# Patient Record
Sex: Male | Born: 1995 | Race: White | Hispanic: No | Marital: Single | Smoking: Current some day smoker
Health system: Southern US, Community
[De-identification: ages and names within clinical notes are randomized; demographics above are authoritative.]

## PROBLEM LIST (undated history)

## (undated) DIAGNOSIS — F319 Bipolar disorder, unspecified: Secondary | ICD-10-CM

---
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
## 2015-10-16 ENCOUNTER — Emergency Department (HOSPITAL_COMMUNITY)
Admission: EM | Admit: 2015-10-16 | Discharge: 2015-10-17 | Discharge status: Against Medical Advice | Payer: Self-pay | Attending: Emergency Medicine | Admitting: Emergency Medicine

## 2015-10-16 ENCOUNTER — Emergency Department (HOSPITAL_COMMUNITY): Payer: Self-pay

## 2015-10-16 ENCOUNTER — Encounter (HOSPITAL_COMMUNITY): Payer: Self-pay | Admitting: Emergency Medicine

## 2015-10-16 DIAGNOSIS — L03211 Cellulitis of face: Secondary | ICD-10-CM

## 2015-10-16 DIAGNOSIS — R110 Nausea: Secondary | ICD-10-CM | POA: Insufficient documentation

## 2015-10-16 DIAGNOSIS — F17200 Nicotine dependence, unspecified, uncomplicated: Secondary | ICD-10-CM | POA: Insufficient documentation

## 2015-10-16 DIAGNOSIS — K047 Periapical abscess without sinus: Secondary | ICD-10-CM | POA: Insufficient documentation

## 2015-10-16 DIAGNOSIS — Z791 Long term (current) use of non-steroidal anti-inflammatories (NSAID): Secondary | ICD-10-CM | POA: Insufficient documentation

## 2015-10-16 LAB — CBC WITH DIFFERENTIAL/PLATELET
BASOS ABS: 0 10*3/uL (ref 0.0–0.1)
BASOS PCT: 0 %
EOS PCT: 1 %
Eosinophils Absolute: 0.2 10*3/uL (ref 0.0–0.7)
HCT: 47.9 % (ref 39.0–52.0)
Hemoglobin: 17.1 g/dL — ABNORMAL HIGH (ref 13.0–17.0)
Lymphocytes Relative: 17 %
Lymphs Abs: 2.8 10*3/uL (ref 0.7–4.0)
MCH: 32.3 pg (ref 26.0–34.0)
MCHC: 35.7 g/dL (ref 30.0–36.0)
MCV: 90.5 fL (ref 78.0–100.0)
MONO ABS: 1.7 10*3/uL — AB (ref 0.1–1.0)
MONOS PCT: 10 %
Neutro Abs: 11.9 10*3/uL — ABNORMAL HIGH (ref 1.7–7.7)
Neutrophils Relative %: 72 %
PLATELETS: 228 10*3/uL (ref 150–400)
RBC: 5.29 MIL/uL (ref 4.22–5.81)
RDW: 12.6 % (ref 11.5–15.5)
WBC: 16.7 10*3/uL — ABNORMAL HIGH (ref 4.0–10.5)

## 2015-10-16 LAB — BASIC METABOLIC PANEL
ANION GAP: 10 (ref 5–15)
BUN: 15 mg/dL (ref 6–20)
CALCIUM: 9.3 mg/dL (ref 8.9–10.3)
CO2: 25 mmol/L (ref 22–32)
CREATININE: 0.91 mg/dL (ref 0.61–1.24)
Chloride: 100 mmol/L — ABNORMAL LOW (ref 101–111)
GFR calc Af Amer: >60 mL/min (ref >60)
GLUCOSE: 86 mg/dL (ref 65–99)
Potassium: 3.8 mmol/L (ref 3.5–5.1)
Sodium: 135 mmol/L (ref 135–145)

## 2015-10-16 MED ORDER — MORPHINE SULFATE (PF) 4 MG/ML IV SOLN
4.0000 mg | Freq: Once | INTRAVENOUS | Status: AC
  Administered 2015-10-16: 4 mg via INTRAVENOUS
  Filled 2015-10-16: qty 1

## 2015-10-16 MED ORDER — IOPAMIDOL (ISOVUE-300) INJECTION 61%
75.0000 mL | Freq: Once | INTRAVENOUS | Status: AC | PRN
Start: 2015-10-16 — End: 2015-10-16
  Administered 2015-10-16: 75 mL via INTRAVENOUS

## 2015-10-16 MED ORDER — CLINDAMYCIN PHOSPHATE 600 MG/50ML IV SOLN
600.0000 mg | Freq: Once | INTRAVENOUS | Status: AC
  Administered 2015-10-16: 600 mg via INTRAVENOUS
  Filled 2015-10-16: qty 50

## 2015-10-16 MED ORDER — ONDANSETRON 8 MG PO TBDP
8.0000 mg | ORAL_TABLET | Freq: Once | ORAL | Status: AC
  Administered 2015-10-16: 8 mg via ORAL
  Filled 2015-10-16: qty 1

## 2015-10-16 NOTE — ED Triage Notes (Signed)
Pt c/o right sided mouth pain x 4 days.

## 2015-10-17 MED ORDER — HYDROCODONE-ACETAMINOPHEN 5-325 MG PO TABS: 1.0000 | ORAL_TABLET | ORAL | 0 refills | Status: DC | PRN

## 2015-10-17 MED ORDER — CLINDAMYCIN HCL 150 MG PO CAPS: 300.0000 mg | ORAL_CAPSULE | Freq: Four times a day (QID) | ORAL | 0 refills | Status: DC

## 2015-10-17 MED ORDER — HYDROCODONE-ACETAMINOPHEN 5-325 MG PO TABS: 2.0000 | ORAL_TABLET | ORAL | 0 refills | Status: DC | PRN

## 2015-10-17 NOTE — ED Provider Notes (Signed)
[facility] DEPT Provider Note   CSN: [id number] Arrival date & time: 10/16/15  1943     History   Chief Complaint Chief Complaint  Patient presents with  . Dental Pain    HPI Clinton Roberts is a 20 y.o. male.  The history is provided by the patient and the spouse.  Dental Pain   This is a new problem. Episode onset: 4 days ago. The problem occurs constantly. The problem has been gradually worsening. The pain is at a severity of 10/10. The pain is severe. The treatment provided no (He has tried ibuprofen without relief.  describes chronic dental decay but no problems with infections/ abscess until this week.  He does not have a dentist.) relief.    Past Medical History:  Diagnosis Date  . Bipolar disorder (HCC)     There are no active problems to display for this patient.   Past Surgical History:  Procedure Laterality Date  . I&D EXTREMITY  08/02/2011   Procedure: IRRIGATION AND DEBRIDEMENT EXTREMITY;  Surgeon: Sharma Covert, MD;  Location: [facility] OR;  Service: Orthopedics;  Laterality: Right;  Irrigation and debridement with tendon repair and closure of lacerations right forearm       Home Medications    Prior to Admission medications   Medication Sig Start Date End Date Taking? Authorizing Provider  ibuprofen (ADVIL,MOTRIN) 200 MG tablet Take 200 mg by mouth every 6 (six) hours as needed for mild pain or moderate pain.   Yes Historical Provider, MD  clindamycin (CLEOCIN) 150 MG capsule Take 2 capsules (300 mg total) by mouth 4 (four) times daily. 10/17/15   Burgess Amor, PA-C  HYDROcodone-acetaminophen (NORCO/VICODIN) 5-325 MG tablet Take 1 tablet by mouth every 4 (four) hours as needed. 10/17/15   Burgess Amor, PA-C  HYDROcodone-acetaminophen (NORCO/VICODIN) 5-325 MG tablet Take 2 tablets by mouth every 4 (four) hours as needed. 10/17/15   Burgess Amor, PA-C    Family History No family history on file.  Social History Social History  Substance Use Topics  . Smoking status:  Current Some Day Smoker  . Smokeless tobacco: Never Used  . Alcohol use Yes     Allergies   Review of patient's allergies indicates no known allergies.   Review of Systems Review of Systems  Constitutional: Positive for chills. Negative for fever.  HENT: Positive for dental problem and facial swelling. Negative for sore throat.   Eyes: Positive for pain. Negative for photophobia and visual disturbance.  Respiratory: Negative for shortness of breath.   Gastrointestinal: Positive for nausea. Negative for vomiting.  Musculoskeletal: Negative for neck pain and neck stiffness.  Skin: Positive for color change.     Physical Exam Updated Vital Signs BP 141/72 (BP Location: Left Arm)   Pulse 77   Temp 98.6 F (37 C) (Oral)   Resp 18   Ht 6\' 1"  (1.854 m)   Wt 83.9 kg   SpO2 99%   BMI 24.41 kg/m   Physical Exam  Constitutional: He is oriented to person, place, and time. He appears well-developed and well-nourished. No distress.  Appears uncomfortable.  HENT:  Head: Normocephalic and atraumatic.  Right Ear: Tympanic membrane and external ear normal.  Left Ear: Tympanic membrane and external ear normal.  Nose: Sinus tenderness present.  Mouth/Throat: Oropharynx is clear and moist and mucous membranes are normal. No oral lesions. No trismus in the jaw. Dental abscesses and dental caries present.  Induration and ttp with mild erythema along left nose tracking nearly to  his medial canthus.  Ttp. No fluctuance.  No areas of fluctuance found within oral mucosa.  Poor dentition with multiple areas of decay.  Infection begins around the left upper molars, possibly involving several teeth.   Eyes: Conjunctivae are normal. Left eye exhibits no chemosis, no discharge and no exudate.  Ocular pain with inferior gaze and medial gaze.  Neck: Normal range of motion. Neck supple.  Cardiovascular: Normal rate and normal heart sounds.   Pulmonary/Chest: Effort normal.  Abdominal: He exhibits no  distension.  Musculoskeletal: Normal range of motion.  Lymphadenopathy:    He has no cervical adenopathy.  Neurological: He is alert and oriented to person, place, and time.  Skin: Skin is warm and dry. No erythema.  Psychiatric: He has a normal mood and affect.     ED Treatments / Results  Labs (all labs ordered are listed, but only abnormal results are displayed) Labs Reviewed  CBC WITH DIFFERENTIAL/PLATELET - Abnormal; Notable for the following:       Result Value   WBC 16.7 (*)    Hemoglobin 17.1 (*)    Neutro Abs 11.9 (*)    Monocytes Absolute 1.7 (*)    All other components within normal limits  BASIC METABOLIC PANEL - Abnormal; Notable for the following:    Chloride 100 (*)    All other components within normal limits    EKG  EKG Interpretation None       Radiology Ct Maxillofacial W Contrast  Result Date: 10/16/2015 CLINICAL DATA:  Right-sided jaw pain and swelling. Concern for abscess. EXAM: CT MAXILLOFACIAL WITH CONTRAST TECHNIQUE: Multidetector CT imaging of the maxillofacial structures was performed with intravenous contrast. Multiplanar CT image reconstructions were also generated. A small metallic BB was placed on the right temple in order to reliably differentiate right from left. CONTRAST:  75mL ISOVUE-300 IOPAMIDOL (ISOVUE-300) INJECTION 61% COMPARISON:  None. FINDINGS: Osseous: --Complex facial fracture types: No LeFort, zygomaticomaxillary complex or nasoorbitoethmoidal fracture. --Simple fracture types: None. Orbits: The globes appear intact. Normal appearance of the intra- and extraconal fat. Symmetric extraocular muscles. Sinuses: No fluid levels or advanced mucosal thickening. Soft tissues: There are numerous bilateral cervical lymph nodes measuring up to 1.2 cm. No focal fluid collection or evidence of abscess. Prominence of the adenoid tonsils is normal for age. No retropharyngeal abscess. No necrotic adenopathy. Oral cavity: There is evidence of advanced  dental disease with large caries within the right first mandibular molar, at the right maxillary central incisor and at the left maxillary second molar. There are prominent periapical lucencies at the roots of the left maxillary first and second molars. There is no fluid collection or abscess within the oral cavity. The soft tissues adjacent to the right mandible are unremarkable. Limited intracranial: Normal. IMPRESSION: 1. No evidence of facial or oral cavity abscess or drainable fluid collection. 2. Multifocal severe dental disease, worst at the right first mandibular molar and maxillary central incisor and at the left maxillary first and second molars. Electronically Signed   By: Deatra Robinson M.D.   On: 10/16/2015 23:31    Procedures Procedures (including critical care time)  Medications Ordered in ED Medications  clindamycin (CLEOCIN) IVPB 600 mg (0 mg Intravenous Stopped 10/17/15 0016)  ondansetron (ZOFRAN-ODT) disintegrating tablet 8 mg (8 mg Oral Given 10/16/15 2213)  morphine 4 MG/ML injection 4 mg (4 mg Intravenous Given 10/16/15 2212)  iopamidol (ISOVUE-300) 61 % injection 75 mL (75 mLs Intravenous Contrast Given 10/16/15 2251)     Initial Impression /  Assessment and Plan / ED Course  I have reviewed the triage vital signs and the nursing notes.  Pertinent labs & imaging results that were available during my care of the patient were reviewed by me and considered in my medical decision making (see chart for details).  Clinical Course    No abscess or periorbital cellulitis per CT.  Pt was given iv clindamycin, advised overnight stay for further abx, pt declined, stating he has to sign a lease in the morning on their new house or will lose housing.  Pt will be signed out ama.  Prescribed clindamycin, hydrocodone given.  Given strict return precautions.  Pt and wife understand plan and will return in 24 hours for a recheck if sx are not starting to regress, sooner for any worsened sx.  Pt  was seen by Dr. Adriana Simasook during this visit.  Final Clinical Impressions(s) / ED Diagnoses   Final diagnoses:  Dental infection  Facial cellulitis    New Prescriptions Discharge Medication List as of 10/17/2015 12:08 AM    START taking these medications   Details  clindamycin (CLEOCIN) 150 MG capsule Take 2 capsules (300 mg total) by mouth 4 (four) times daily., Starting Fri 10/17/2015, Print    !! HYDROcodone-acetaminophen (NORCO/VICODIN) 5-325 MG tablet Take 1 tablet by mouth every 4 (four) hours as needed., Starting Fri 10/17/2015, Print    !! HYDROcodone-acetaminophen (NORCO/VICODIN) 5-325 MG tablet Take 2 tablets by mouth every 4 (four) hours as needed., Starting Fri 10/17/2015, Print     !! - Potential duplicate medications found. Please discuss with provider.       Burgess AmorJulie Drea Jurewicz, PA-C 10/17/15 1415    Donnetta HutchingBrian Cook, MD 10/20/15 [phone]

## 2015-10-18 ENCOUNTER — Emergency Department (HOSPITAL_COMMUNITY)
Admission: EM | Admit: 2015-10-18 | Discharge: 2015-10-18 | Disposition: A | Discharge status: Home / Self Care | Payer: Medicaid Other | Attending: Emergency Medicine | Admitting: Emergency Medicine

## 2015-10-18 ENCOUNTER — Encounter (HOSPITAL_COMMUNITY): Payer: Self-pay | Admitting: Emergency Medicine

## 2015-10-18 DIAGNOSIS — F17210 Nicotine dependence, cigarettes, uncomplicated: Secondary | ICD-10-CM | POA: Insufficient documentation

## 2015-10-18 DIAGNOSIS — K029 Dental caries, unspecified: Secondary | ICD-10-CM | POA: Insufficient documentation

## 2015-10-18 LAB — CBC WITH DIFFERENTIAL/PLATELET
Basophils Absolute: 0 10*3/uL (ref 0.0–0.1)
Basophils Relative: 0 %
EOS PCT: 1 %
Eosinophils Absolute: 0.1 10*3/uL (ref 0.0–0.7)
HEMATOCRIT: 44.7 % (ref 39.0–52.0)
Hemoglobin: 15.8 g/dL (ref 13.0–17.0)
Lymphocytes Relative: 22 %
Lymphs Abs: 2.7 10*3/uL (ref 0.7–4.0)
MCH: 31.9 pg (ref 26.0–34.0)
MCHC: 35.3 g/dL (ref 30.0–36.0)
MCV: 90.3 fL (ref 78.0–100.0)
MONO ABS: 1.4 10*3/uL — AB (ref 0.1–1.0)
Monocytes Relative: 11 %
NEUTROS ABS: 8 10*3/uL — AB (ref 1.7–7.7)
Neutrophils Relative %: 66 %
PLATELETS: 218 10*3/uL (ref 150–400)
RBC: 4.95 MIL/uL (ref 4.22–5.81)
RDW: 12.4 % (ref 11.5–15.5)
WBC: 12.1 10*3/uL — ABNORMAL HIGH (ref 4.0–10.5)

## 2015-10-18 LAB — BASIC METABOLIC PANEL
Anion gap: 5 (ref 5–15)
BUN: 10 mg/dL (ref 6–20)
CALCIUM: 9.2 mg/dL (ref 8.9–10.3)
CO2: 27 mmol/L (ref 22–32)
CREATININE: 0.79 mg/dL (ref 0.61–1.24)
Chloride: 103 mmol/L (ref 101–111)
GFR calc Af Amer: >60 mL/min (ref >60)
Glucose, Bld: 88 mg/dL (ref 65–99)
POTASSIUM: 3.9 mmol/L (ref 3.5–5.1)
SODIUM: 135 mmol/L (ref 135–145)

## 2015-10-18 LAB — LACTIC ACID, PLASMA: LACTIC ACID, VENOUS: 0.5 mmol/L (ref 0.5–1.9)

## 2015-10-18 MED ORDER — CLINDAMYCIN PHOSPHATE 600 MG/50ML IV SOLN
600.0000 mg | Freq: Once | INTRAVENOUS | Status: AC
  Administered 2015-10-18: 600 mg via INTRAVENOUS
  Filled 2015-10-18: qty 50

## 2015-10-18 MED ORDER — MORPHINE SULFATE (PF) 4 MG/ML IV SOLN
4.0000 mg | INTRAVENOUS | Status: DC | PRN
  Administered 2015-10-18: 4 mg via INTRAVENOUS
  Filled 2015-10-18: qty 1

## 2015-10-18 NOTE — ED Triage Notes (Signed)
Reports pain and swelling in upper left mouth.  Was here a few days ago for same and swelling getting worse.  Reports taking abx.

## 2015-10-18 NOTE — Discharge Instructions (Signed)
Take continue to take your prescriptions as previously directed. Rinse your mouth frequently with warm water daily. Call your regular dentist on Monday to schedule a follow up appointment within the next 48 hours. If you cannot be seen by your dentist or family doctor, the return to the ED for a re-check. Return to the Emergency Department immediately sooner if worsening.

## 2015-10-18 NOTE — ED Provider Notes (Signed)
[facility] Provider Note   CSN: [id number] Arrival date & time: 10/18/15  1150     History   Chief Complaint Chief Complaint  Patient presents with  . Dental Pain    HPI Clinton Roberts is a 20 y.o. male.  HPI  Pt was seen at 1350. Per pt, c/o gradual onset and worsening of persistent left upper teeth "pain" for the past 6 days. Has been associated with facial swelling and subjective fevers/chills. Pt was evaluated in the ED 2 days ago for his symptoms, CT negative for abscess, dx dental infection, and rx clindamycin. Pt states he took his first dose of antibiotic yesterday. Pt states last night the pain in his teeth "got worse" and "then a spot opened up and started to drain" which improved the discomfort.  Pt states he "was supposed to get admitted" but left AMA due to housing issues.  Denies fevers, no intra-oral edema, no rash, no dysphagia, no neck pain.     Past Medical History:  Diagnosis Date  . Bipolar disorder (HCC)     There are no active problems to display for this patient.   Past Surgical History:  Procedure Laterality Date  . I&D EXTREMITY  08/02/2011   Procedure: IRRIGATION AND DEBRIDEMENT EXTREMITY;  Surgeon: Sharma CovertFred W Ortmann, MD;  Location: [facility] OR;  Service: Orthopedics;  Laterality: Right;  Irrigation and debridement with tendon repair and closure of lacerations right forearm       Home Medications    Prior to Admission medications   Medication Sig Start Date End Date Taking? Authorizing Provider  clindamycin (CLEOCIN) 150 MG capsule Take 2 capsules (300 mg total) by mouth 4 (four) times daily. 10/17/15   Burgess AmorJulie Idol, PA-C  HYDROcodone-acetaminophen (NORCO/VICODIN) 5-325 MG tablet Take 1 tablet by mouth every 4 (four) hours as needed. 10/17/15   Burgess AmorJulie Idol, PA-C  HYDROcodone-acetaminophen (NORCO/VICODIN) 5-325 MG tablet Take 2 tablets by mouth every 4 (four) hours as needed. 10/17/15   Burgess AmorJulie Idol, PA-C  ibuprofen (ADVIL,MOTRIN) 200 MG tablet Take 200 mg by  mouth every 6 (six) hours as needed for mild pain or moderate pain.    Historical Provider, MD    Family History History reviewed. No pertinent family history.  Social History Social History  Substance Use Topics  . Smoking status: Current Some Day Smoker    Packs/day: 1.00    Types: Cigarettes  . Smokeless tobacco: Never Used  . Alcohol use Yes     Allergies   Review of patient's allergies indicates no known allergies.   Review of Systems Review of Systems ROS: Statement: All systems negative except as marked or noted in the HPI; Constitutional: Negative for objective fever and +chills. ; ; Eyes: Negative for eye pain and discharge. ; ; ENMT: Positive for facial swelling, dental caries, dental hygiene poor and toothache. Negative for ear pain, bleeding gums, dental injury, facial deformity, hoarseness, nasal congestion, sinus pressure, sore throat, throat swelling and tongue swollen. ; ; Cardiovascular: Negative for chest pain, palpitations, diaphoresis, dyspnea and peripheral edema. ; ; Respiratory: Negative for cough, wheezing and stridor. ; ; Gastrointestinal: Negative for nausea, vomiting, diarrhea and abdominal pain. ; ; Genitourinary: Negative for dysuria, flank pain and hematuria. ; ; Musculoskeletal: Negative for back pain and neck pain. ; ; Skin: Negative for rash and skin lesion. ; ; Neuro: Negative for headache, lightheadedness and neck stiffness. ;    Physical Exam Updated Vital Signs BP 130/88 (BP Location: Left Arm)   Pulse 101  Temp 98.3 F (36.8 C) (Oral)   Resp 16   Ht 6\' 1"  (1.854 m)   Wt 185 lb (83.9 kg)   SpO2 97%   BMI 24.41 kg/m   Physical Exam 1355: Physical examination: Vital signs and O2 SAT: Reviewed; Constitutional: Well developed, Well nourished, Well hydrated, In no acute distress; Head and Face: Normocephalic, Atraumatic. +mild edema left medial cheek, no rash.; Eyes: EOMI, PERRL, No scleral icterus; ENMT: Mouth and pharynx normal, Poor  dentition, Widespread dental decay, Left TM normal, Right TM normal, Mucous membranes moist, +upper left pre-molars and molars with dental decay. +localized edema and drainage from upper left gingival area over pre-molars. No gingival erythema.  No intra-oral edema. No submandibular or sublingual edema. No hoarse voice, no drooling, no stridor. No trismus. ; Neck: Supple, Full range of motion, No lymphadenopathy; Cardiovascular: Regular rate and rhythm, No murmur, rub, or gallop; Respiratory: Breath sounds clear & equal bilaterally, No rales, rhonchi, wheezes, Normal respiratory effort/excursion; Chest: Nontender, Movement normal; Extremities: Pulses normal, No tenderness, No edema; Neuro: AA&Ox3, Major CN grossly intact.  No gross focal motor or sensory deficits in extremities.; Skin: Color normal, No rash, No petechiae, Warm, Dry.     ED Treatments / Results  Labs (all labs ordered are listed, but only abnormal results are displayed)   EKG  EKG Interpretation None       Radiology   Procedures Procedures (including critical care time)  Medications Ordered in ED Medications  clindamycin (CLEOCIN) IVPB 600 mg (not administered)     Initial Impression / Assessment and Plan / ED Course  I have reviewed the triage vital signs and the nursing notes.  Pertinent labs & imaging results that were available during my care of the patient were reviewed by me and considered in my medical decision making (see chart for details).  MDM Reviewed: previous chart, nursing note and vitals Reviewed previous: labs and CT scan Interpretation: labs    Results for orders placed or performed during the hospital encounter of 10/18/15  Basic metabolic panel  Result Value Ref Range   Sodium 135 135 - 145 mmol/L   Potassium 3.9 3.5 - 5.1 mmol/L   Chloride 103 101 - 111 mmol/L   CO2 27 22 - 32 mmol/L   Glucose, Bld 88 65 - 99 mg/dL   BUN 10 6 - 20 mg/dL   Creatinine, Ser 1.61 0.61 - 1.24 mg/dL    Calcium 9.2 8.9 - 09.6 mg/dL   GFR calc non Af Amer >60 >60 mL/min   GFR calc Af Amer >60 >60 mL/min   Anion gap 5 5 - 15  Lactic acid, plasma  Result Value Ref Range   Lactic Acid, Venous 0.5 0.5 - 1.9 mmol/L  CBC with Differential  Result Value Ref Range   WBC 12.1 (H) 4.0 - 10.5 K/uL   RBC 4.95 4.22 - 5.81 MIL/uL   Hemoglobin 15.8 13.0 - 17.0 g/dL   HCT 04.5 40.9 - 81.1 %   MCV 90.3 78.0 - 100.0 fL   MCH 31.9 26.0 - 34.0 pg   MCHC 35.3 30.0 - 36.0 g/dL   RDW 91.4 78.2 - 95.6 %   Platelets 218 150 - 400 K/uL   Neutrophils Relative % 66 %   Neutro Abs 8.0 (H) 1.7 - 7.7 K/uL   Lymphocytes Relative 22 %   Lymphs Abs 2.7 0.7 - 4.0 K/uL   Monocytes Relative 11 %   Monocytes Absolute 1.4 (H) 0.1 - 1.0 K/uL  Eosinophils Relative 1 %   Eosinophils Absolute 0.1 0.0 - 0.7 K/uL   Basophils Relative 0 %   Basophils Absolute 0.0 0.0 - 0.1 K/uL     Ct Maxillofacial W Contrast Result Date: 10/16/2015 CLINICAL DATA:  Right-sided jaw pain and swelling. Concern for abscess. EXAM: CT MAXILLOFACIAL WITH CONTRAST TECHNIQUE: Multidetector CT imaging of the maxillofacial structures was performed with intravenous contrast. Multiplanar CT image reconstructions were also generated. A small metallic BB was placed on the right temple in order to reliably differentiate right from left. CONTRAST:  75mL ISOVUE-300 IOPAMIDOL (ISOVUE-300) INJECTION 61% COMPARISON:  None. FINDINGS: Osseous: --Complex facial fracture types: No LeFort, zygomaticomaxillary complex or nasoorbitoethmoidal fracture. --Simple fracture types: None. Orbits: The globes appear intact. Normal appearance of the intra- and extraconal fat. Symmetric extraocular muscles. Sinuses: No fluid levels or advanced mucosal thickening. Soft tissues: There are numerous bilateral cervical lymph nodes measuring up to 1.2 cm. No focal fluid collection or evidence of abscess. Prominence of the adenoid tonsils is normal for age. No retropharyngeal abscess. No  necrotic adenopathy. Oral cavity: There is evidence of advanced dental disease with large caries within the right first mandibular molar, at the right maxillary central incisor and at the left maxillary second molar. There are prominent periapical lucencies at the roots of the left maxillary first and second molars. There is no fluid collection or abscess within the oral cavity. The soft tissues adjacent to the right mandible are unremarkable. Limited intracranial: Normal. IMPRESSION: 1. No evidence of facial or oral cavity abscess or drainable fluid collection. 2. Multifocal severe dental disease, worst at the right first mandibular molar and maxillary central incisor and at the left maxillary first and second molars. Electronically Signed   By: Deatra Robinson M.D.   On: 10/16/2015 23:31     1450:  IV clindamycin given in ED. Pt's WBC count trending downward, lactic acid is normal and pt remains afebrile. Previous ED note states pt had erythema to his nose/face; none present today. CT scan 2 days ago without abscess; spontaneous drainage today likely from periapical dental infection(s). Pt has only started to take his abx yesterday. Pt wants to go home. No clear indication for admission at this time. Will have pt return to ED for re-eval in 48 hrs (sooner if worsening). Encouraged to obtain dentist. Dx and testing d/w pt.  Questions answered.  Verb understanding, agreeable to d/c home with outpt f/u.      Final Clinical Impressions(s) / ED Diagnoses   Final diagnoses:  None    New Prescriptions New Prescriptions   No medications on file     Samuel Jester, DO 10/21/15 1705

## 2015-10-18 NOTE — ED Notes (Signed)
Pt reports being tx for "internal skin infection" earlier this week with IV and PO antibx. States taking pain medication and antibx at home without relief. Pt has swelling noted to left cheek, purulent drainage noted when assessing mucus membranes.

## 2015-10-20 MED FILL — Hydrocodone-Acetaminophen Tab 5-325 MG: ORAL | Qty: 6 | Status: AC

## 2015-12-29 ENCOUNTER — Emergency Department (HOSPITAL_COMMUNITY)
Admission: EM | Admit: 2015-12-29 | Discharge: 2015-12-29 | Disposition: A | Discharge status: Home / Self Care | Payer: Medicaid Other | Attending: Emergency Medicine | Admitting: Emergency Medicine

## 2015-12-29 ENCOUNTER — Encounter (HOSPITAL_COMMUNITY): Payer: Self-pay | Admitting: Emergency Medicine

## 2015-12-29 DIAGNOSIS — F17210 Nicotine dependence, cigarettes, uncomplicated: Secondary | ICD-10-CM | POA: Insufficient documentation

## 2015-12-29 DIAGNOSIS — K047 Periapical abscess without sinus: Secondary | ICD-10-CM

## 2015-12-29 MED ORDER — HYDROCODONE-ACETAMINOPHEN 5-325 MG PO TABS
1.0000 | ORAL_TABLET | Freq: Once | ORAL | Status: AC
  Administered 2015-12-29: 1 via ORAL
  Filled 2015-12-29: qty 1

## 2015-12-29 MED ORDER — CLINDAMYCIN HCL 150 MG PO CAPS
300.0000 mg | ORAL_CAPSULE | Freq: Once | ORAL | Status: AC
  Administered 2015-12-29: 300 mg via ORAL
  Filled 2015-12-29: qty 2

## 2015-12-29 MED ORDER — CLINDAMYCIN HCL 150 MG PO CAPS
300.0000 mg | ORAL_CAPSULE | Freq: Four times a day (QID) | ORAL | 0 refills | Status: AC
Start: 2015-12-29 — End: 2016-01-05

## 2015-12-29 MED ORDER — HYDROCODONE-ACETAMINOPHEN 5-325 MG PO TABS: 1.0000 | ORAL_TABLET | ORAL | 0 refills | Status: AC | PRN

## 2015-12-29 NOTE — ED Triage Notes (Signed)
Pt reports abscess to left upper teeth that has been present x4 months, pain started back again today, denies odor, foul taste, or drainage.

## 2015-12-29 NOTE — Discharge Instructions (Signed)
Complete your entire course of antibiotics as prescribed.  You  may use the hydrocodone for pain relief but do not drive within 4 hours of taking as this will make you drowsy.  Avoid applying heat or ice to this abscess area which can worsen your symptoms.  You may use warm salt water swish and spit treatment or half peroxide and water swish and spit after meals to keep this area clean as discussed.  Call the dentist listed above for further management of your symptoms.  

## 2015-12-31 NOTE — ED Provider Notes (Signed)
[facility] DEPT Provider Note   CSN: [id number] Arrival date & time: 12/29/15  [id number]     History   Chief Complaint Chief Complaint  Patient presents with  . Dental Pain    HPI Clinton Roberts is a 20 y.o. male presenting with a 1 day history of dental pain and gingival swelling.   The patient has a history of  decay in the tooth involved which has recently started to cause increased  Pain and swelling.  He was seen here for the same complaint 2 months ago at which time he had significant facial cellulitis and edema requiring IV abx and was recommended admission but he refused.  Unfortunately he has been unable to f/u with a dentist for this issue given lack of insurance and money .  There has been no fevers, chills, nausea or vomiting, also no complaint of difficulty swallowing, although chewing makes pain worse.  The patient has tried noting prior to arrival.    The history is provided by the patient.    Past Medical History:  Diagnosis Date  . Bipolar disorder (HCC)     There are no active problems to display for this patient.   Past Surgical History:  Procedure Laterality Date  . I&D EXTREMITY  08/02/2011   Procedure: IRRIGATION AND DEBRIDEMENT EXTREMITY;  Surgeon: Sharma CovertFred W Ortmann, MD;  Location: [facility] OR;  Service: Orthopedics;  Laterality: Right;  Irrigation and debridement with tendon repair and closure of lacerations right forearm       Home Medications    Prior to Admission medications   Medication Sig Start Date End Date Taking? Authorizing Provider  clindamycin (CLEOCIN) 150 MG capsule Take 2 capsules (300 mg total) by mouth every 6 (six) hours. 12/29/15 01/05/16  Burgess AmorJulie Mick Tanguma, PA-C  HYDROcodone-acetaminophen (NORCO/VICODIN) 5-325 MG tablet Take 1 tablet by mouth every 4 (four) hours as needed. 12/29/15   Burgess AmorJulie Jabron Weese, PA-C  ibuprofen (ADVIL,MOTRIN) 200 MG tablet Take 200 mg by mouth every 6 (six) hours as needed for mild pain or moderate pain.    Historical Provider,  MD    Family History History reviewed. No pertinent family history.  Social History Social History  Substance Use Topics  . Smoking status: Current Some Day Smoker    Packs/day: 1.00    Types: Cigarettes  . Smokeless tobacco: Never Used  . Alcohol use Yes     Allergies   Patient has no known allergies.   Review of Systems Review of Systems  Constitutional: Negative for fever.  HENT: Positive for dental problem. Negative for facial swelling and sore throat.   Respiratory: Negative for shortness of breath.   Musculoskeletal: Negative for neck pain and neck stiffness.     Physical Exam Updated Vital Signs BP 149/93 (BP Location: Right Arm)   Pulse 83   Temp 98.8 F (37.1 C) (Oral)   Resp 16   Ht 6\' 1"  (1.854 m)   Wt 81.6 kg   SpO2 98%   BMI 23.75 kg/m   Physical Exam  Constitutional: He is oriented to person, place, and time. He appears well-developed and well-nourished. No distress.  HENT:  Head: Normocephalic and atraumatic.  Right Ear: Tympanic membrane and external ear normal.  Left Ear: Tympanic membrane and external ear normal.  Mouth/Throat: Oropharynx is clear and moist and mucous membranes are normal. No oral lesions. No trismus in the jaw. Dental abscesses present.  Generalized poor dentition with gingival edema at the left upper premolar teeth. No drainage, no fluctuance.  With the extensive decay it is unclear which is the acutely infected tooth.  There is early induration at the left upper lip. No facial erythema.  Eyes: Conjunctivae are normal.  Neck: Normal range of motion. Neck supple.  Cardiovascular: Normal rate and normal heart sounds.   Pulmonary/Chest: Effort normal.  Musculoskeletal: Normal range of motion.  Lymphadenopathy:    He has no cervical adenopathy.  Neurological: He is alert and oriented to person, place, and time.  Skin: Skin is warm and dry. No erythema.  Psychiatric: He has a normal mood and affect.     ED Treatments /  Results  Labs (all labs ordered are listed, but only abnormal results are displayed) Labs Reviewed - No data to display  EKG  EKG Interpretation None       Radiology No results found.  Procedures Procedures (including critical care time)  Medications Ordered in ED Medications  clindamycin (CLEOCIN) capsule 300 mg (300 mg Oral Given 12/29/15 0946)  HYDROcodone-acetaminophen (NORCO/VICODIN) 5-325 MG per tablet 1 tablet (1 tablet Oral Given 12/29/15 0946)     Initial Impression / Assessment and Plan / ED Course  I have reviewed the triage vital signs and the nursing notes.  Pertinent labs & imaging results that were available during my care of the patient were reviewed by me and considered in my medical decision making (see chart for details).  Clinical Course     Clindamycin, hydrocodone prescribed. Pt advised he needs to f/u with dentistry, referrals given for hopeful low cost care. Pt advised this will keep happening until he gets this addressed appropriately.  Final Clinical Impressions(s) / ED Diagnoses   Final diagnoses:  Dental infection    New Prescriptions Discharge Medication List as of 12/29/2015  9:28 AM       Burgess AmorJulie Mieke Brinley, PA-C 12/31/15 0920    Donnetta HutchingBrian Cook, MD 01/06/16 1142

## 2018-05-10 IMAGING — CT CT MAXILLOFACIAL W/ CM
3 series · 15 of 47 positions shown, 18 images · IV contrast (iopamidol)
Comparison: None.

CLINICAL DATA: Right-sided jaw pain and swelling. Concern for
abscess.

EXAM:
CT MAXILLOFACIAL WITH CONTRAST
TECHNIQUE: Multidetector CT imaging of the maxillofacial structures was
performed with intravenous contrast. Multiplanar CT image
reconstructions were also generated. A small metallic BB was placed
on the right temple in order to reliably differentiate right from
left.
CONTRAST:  75mL [id number] IOPAMIDOL ([id number]) INJECTION 61%

[Series 2: max soft · axial · 0.47mm/px · z∈[+18,+174]mm · 9 of 92 slices shown, 12 images]
[im 7/92  brain]
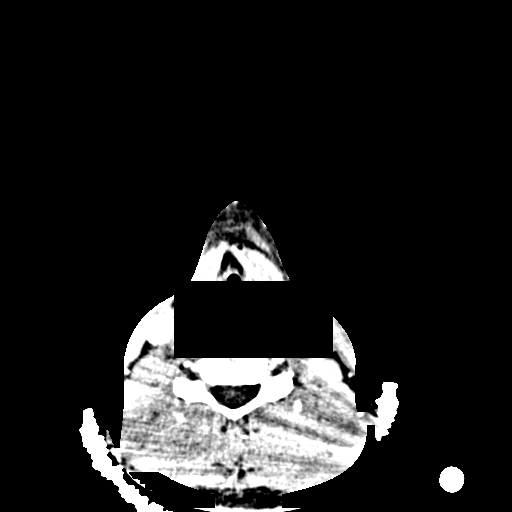
[im 7/92  bone]
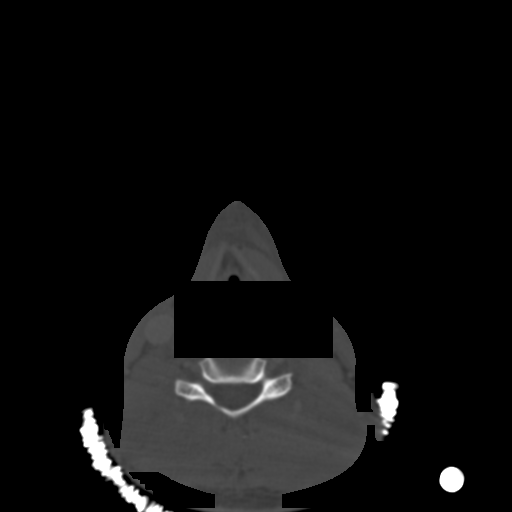
[im 16/92  bone]
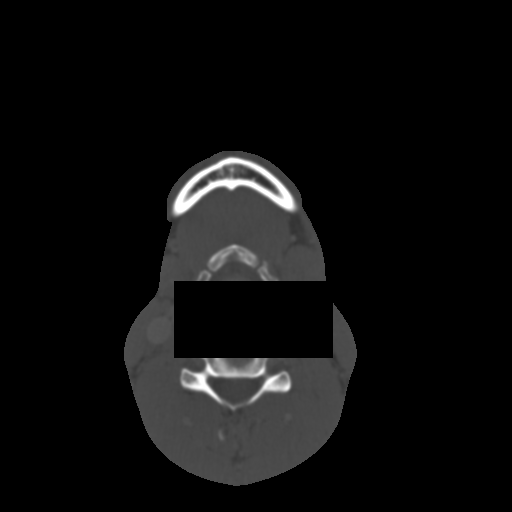
[im 26/92  bone]
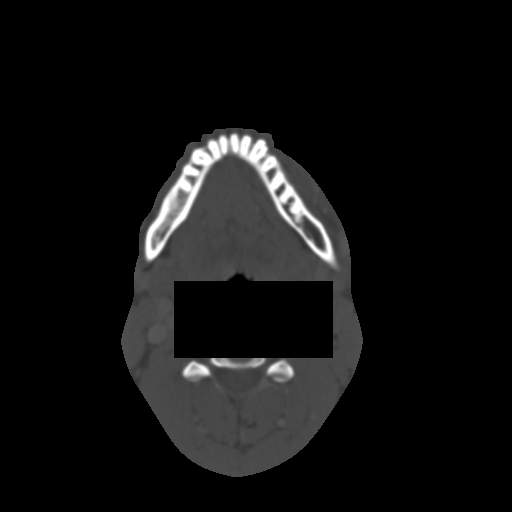
[im 35/92  bone]
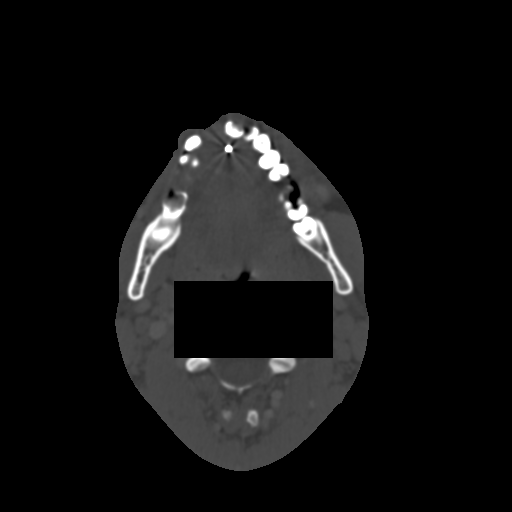
[im 48/92  brain]
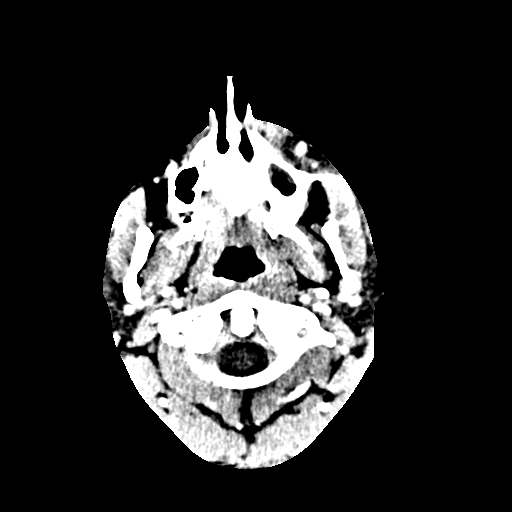
[im 48/92  bone]
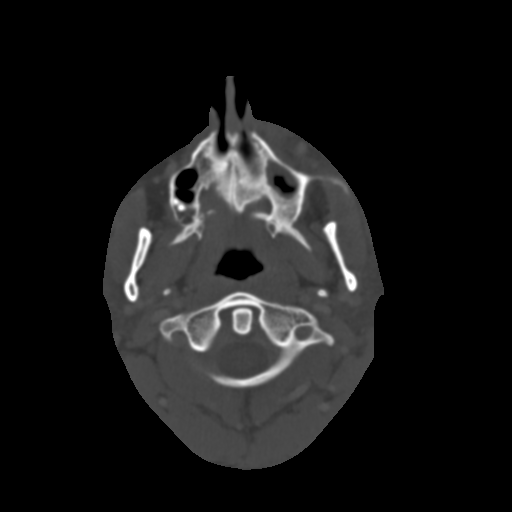
[im 57/92  bone]
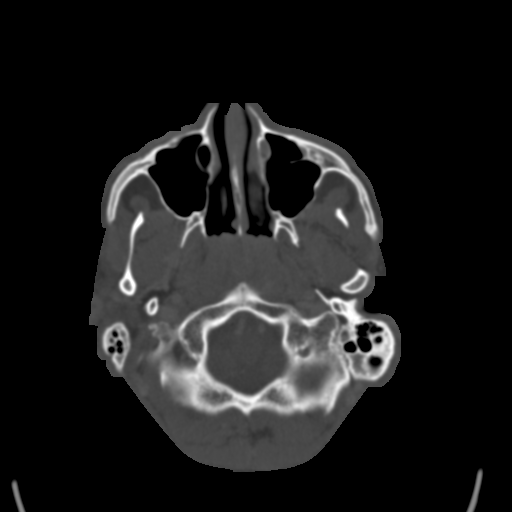
[im 66/92  bone]
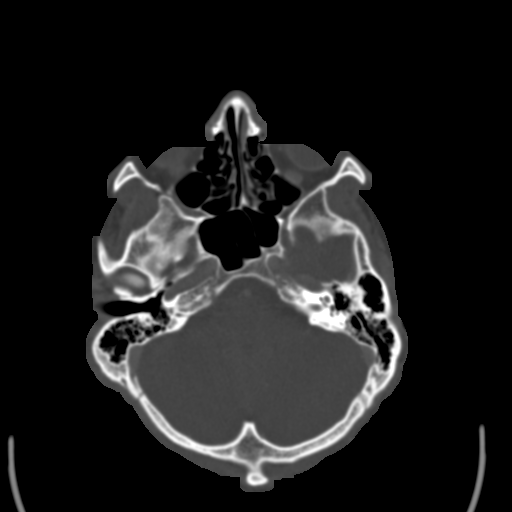
[im 76/92  bone]
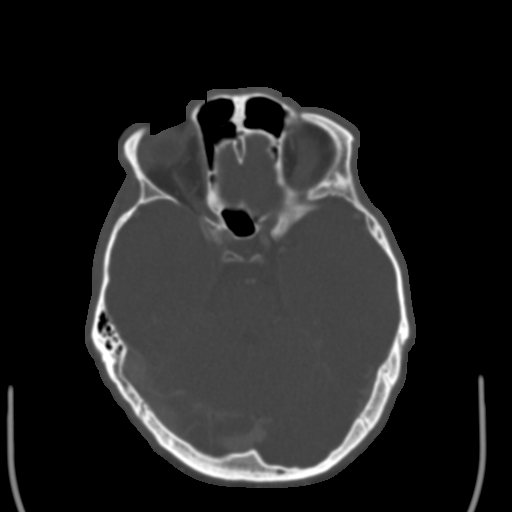
[im 85/92  brain]
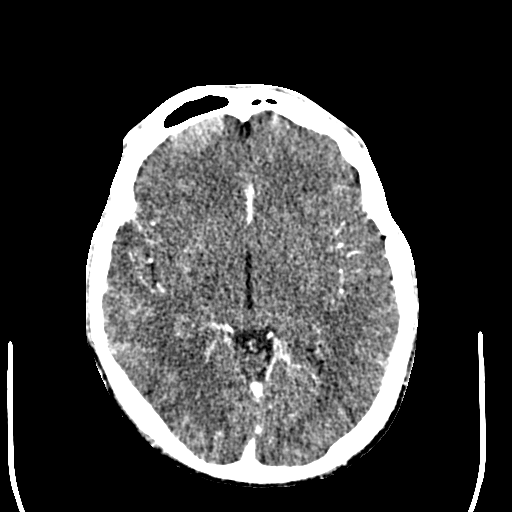
[im 85/92  bone]
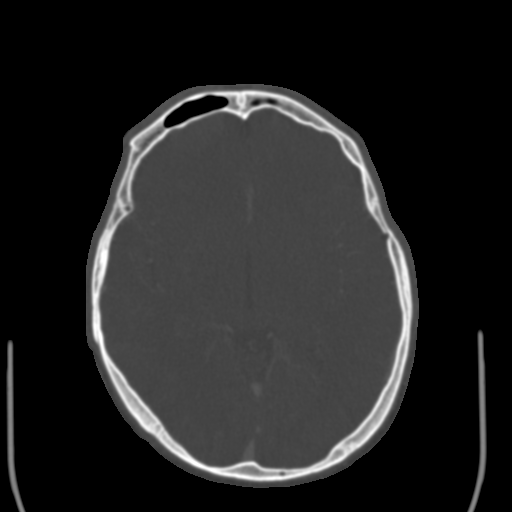

[Series 4: coronal soft · coronal · 0.37mm/px · 3 of 106 slices shown]
[im 36/106  bone]
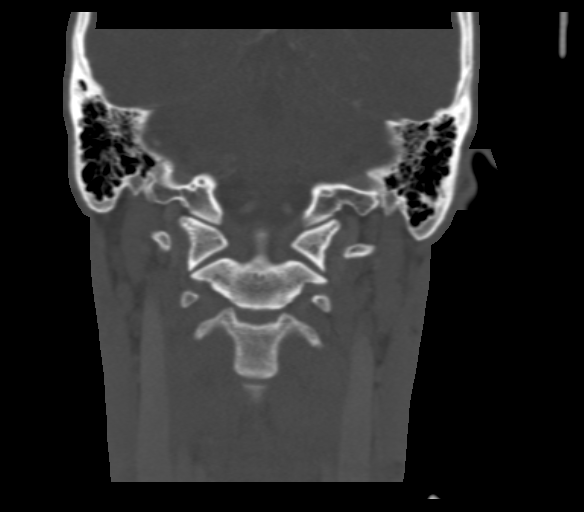
[im 47/106  bone]
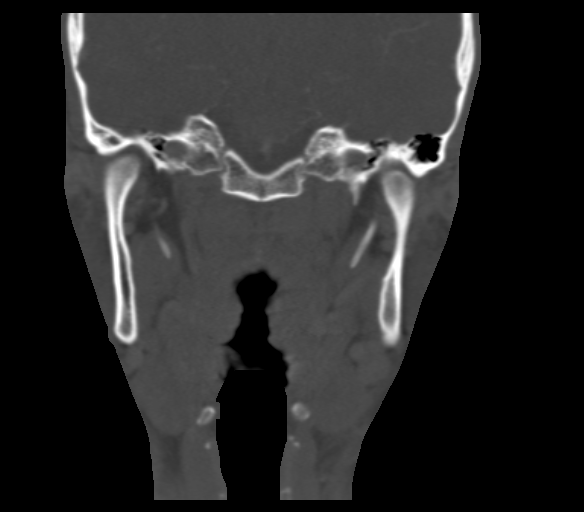
[im 59/106  bone]
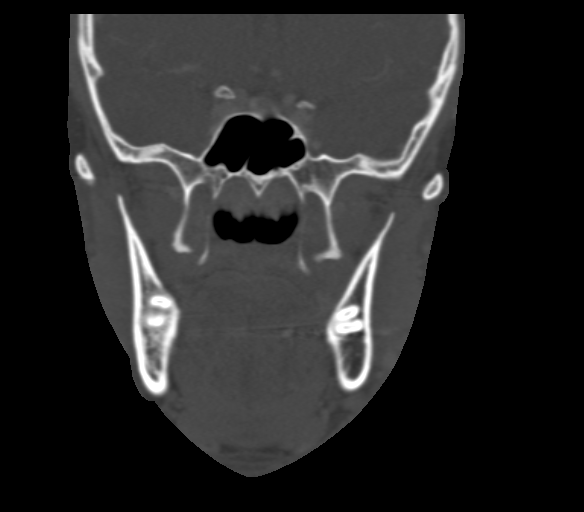

[Series 5: sagittal soft · sagittal · 0.36mm/px · 3 of 81 slices shown]
[im 27/81  bone]
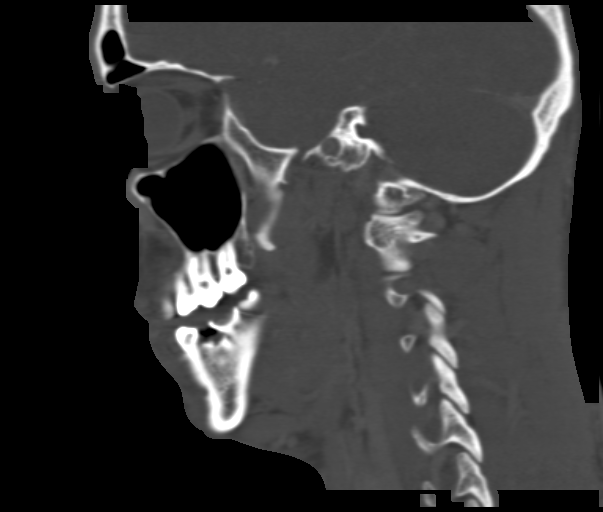
[im 41/81  bone]
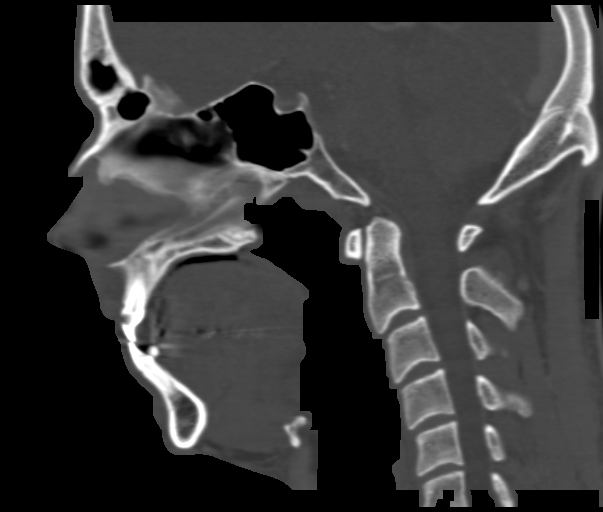
[im 54/81  bone]
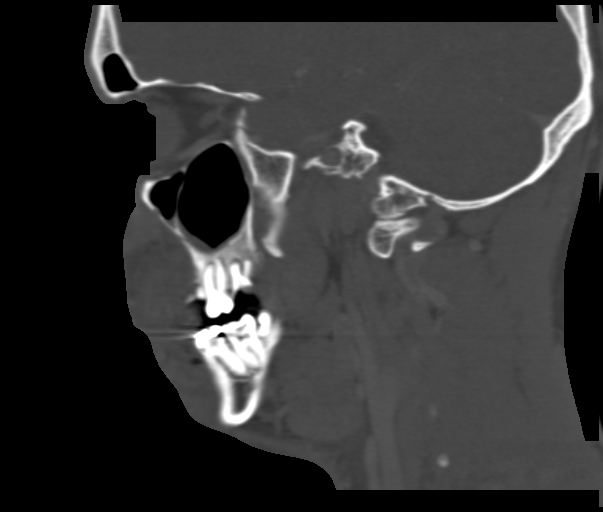

[15 of 47 positions shown; findings below may reference images not displayed]

FINDINGS: Osseous:

--Complex facial fracture types: No Le[place], zygomaticomaxillary
complex or nasoorbitoethmoidal fracture.

--Simple fracture types: None.

Orbits: The globes appear intact. Normal appearance of the intra-
and extraconal fat. Symmetric extraocular muscles.

Sinuses: No fluid levels or advanced mucosal thickening.

Soft tissues: There are numerous bilateral cervical lymph nodes
measuring up to 1.2 cm. No focal fluid collection or evidence of
abscess. Prominence of the adenoid tonsils is normal for age. No
retropharyngeal abscess. No necrotic adenopathy.

Oral cavity: There is evidence of advanced dental disease with large
caries within the right first mandibular molar, at the right
maxillary central incisor and at the left maxillary second molar.
There are prominent periapical lucencies at the roots of the left
maxillary first and second molars. There is no fluid collection or
abscess within the oral cavity. The soft tissues adjacent to the
right mandible are unremarkable.

Limited intracranial: Normal.
IMPRESSION: 1. No evidence of facial or oral cavity abscess or drainable fluid
collection.
2. Multifocal severe dental disease, worst at the right first
mandibular molar and maxillary central incisor and at the left
maxillary first and second molars.

## 2022-05-17 DIAGNOSIS — J188 Other pneumonia, unspecified organism: Secondary | ICD-10-CM | POA: Diagnosis not present

## 2022-05-17 DIAGNOSIS — J189 Pneumonia, unspecified organism: Secondary | ICD-10-CM | POA: Diagnosis not present

## 2022-05-17 DIAGNOSIS — R051 Acute cough: Secondary | ICD-10-CM | POA: Diagnosis not present

## 2022-10-01 DIAGNOSIS — R21 Rash and other nonspecific skin eruption: Secondary | ICD-10-CM | POA: Diagnosis not present

## 2022-10-01 DIAGNOSIS — W57XXXA Bitten or stung by nonvenomous insect and other nonvenomous arthropods, initial encounter: Secondary | ICD-10-CM | POA: Diagnosis not present

## 2022-11-20 DIAGNOSIS — M25552 Pain in left hip: Secondary | ICD-10-CM | POA: Diagnosis not present

## 2023-02-20 DIAGNOSIS — H5213 Myopia, bilateral: Secondary | ICD-10-CM | POA: Diagnosis not present
